# Patient Record
Sex: Male | Born: 1995 | Race: Black or African American | Hispanic: No | Marital: Single | Smoking: Never smoker
Health system: Southern US, Community
[De-identification: ages and names within clinical notes are randomized; demographics above are authoritative.]

---
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
## 2016-12-17 ENCOUNTER — Telehealth: Payer: Self-pay | Admitting: Family Medicine

## 2016-12-17 NOTE — Telephone Encounter (Signed)
Patients mother is calling to check about Alexis Waller's shots.  Mom checked with the health department and was told that [facility] did not have all of his shots in the system.  Mom would like a nurse to look into this and call her back.

## 2016-12-17 NOTE — Telephone Encounter (Signed)
Spoke with patient's mother and informed her patient will need a menactra, hep A,varicella. Patient's mother verbalized understanding.

## 2017-03-30 ENCOUNTER — Ambulatory Visit (HOSPITAL_COMMUNITY)
Admission: RE | Admit: 2017-03-30 | Discharge: 2017-03-30 | Disposition: A | Discharge status: Home / Self Care | Payer: Commercial Managed Care - PPO | Source: Ambulatory Visit | Attending: Family Medicine | Admitting: Family Medicine

## 2017-03-30 ENCOUNTER — Encounter: Payer: Self-pay | Admitting: Family Medicine

## 2017-03-30 ENCOUNTER — Ambulatory Visit (INDEPENDENT_AMBULATORY_CARE_PROVIDER_SITE_OTHER): Payer: Commercial Managed Care - PPO | Admitting: Family Medicine

## 2017-03-30 VITALS — BP 120/80 | Ht 66.5 in | Wt 157.0 lb

## 2017-03-30 DIAGNOSIS — M79661 Pain in right lower leg: Secondary | ICD-10-CM | POA: Diagnosis not present

## 2017-03-30 DIAGNOSIS — M2550 Pain in unspecified joint: Secondary | ICD-10-CM

## 2017-03-30 DIAGNOSIS — R5383 Other fatigue: Secondary | ICD-10-CM | POA: Diagnosis not present

## 2017-03-30 DIAGNOSIS — M79662 Pain in left lower leg: Secondary | ICD-10-CM

## 2017-03-30 DIAGNOSIS — Z79899 Other long term (current) drug therapy: Secondary | ICD-10-CM | POA: Diagnosis not present

## 2017-03-30 DIAGNOSIS — E559 Vitamin D deficiency, unspecified: Secondary | ICD-10-CM

## 2017-03-30 LAB — POCT HEMOGLOBIN: HEMOGLOBIN: 18.7 g/dL — AB (ref 14.1–18.1)

## 2017-03-30 NOTE — Progress Notes (Signed)
   Subjective:    Patient ID: Alexis Waller, male    DOB: 02-May-1995, 21 y.o.   MRN: [id number]  HPI  Patient is here today for a follow up. He states he has been having some lower leg pain.He is also feeling tired a lot.  This patient has been having significant fatigue tiredness feeling rundown in addition to this has noticed intermittent lower leg pain bilateral over the past couple months he also relates knee pain when he does deep squats  He is in college he does not eat healthy he does tend to sleep sometimes a lot other times not as much stays up until somewhere around 10 PM or midnight gets up around 10 AM on the weekends tends to sleep more  Denies fevers chills sweats vomiting diarrhea denies muscle aches headaches does relate lower leg pain  Lower leg pain as a aching discomfort in the lower legs in the tibia-fibula region along with the muscles no specific spot tenderness both legs bother him equally Results for orders placed or performed in visit on 03/30/17  POCT hemoglobin  Result Value Ref Range   Hemoglobin 18.7 (A) 14.1 - 18.1 g/dL    Review of Systems  Constitutional: Negative for activity change, fatigue and fever.  Respiratory: Negative for cough and shortness of breath.   Cardiovascular: Negative for chest pain and leg swelling.  Neurological: Negative for headaches.   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 No distress.  Cardiovascular: Normal rate, regular rhythm and normal heart sounds.  No murmur heard. Pulmonary/Chest: Effort normal and breath sounds normal. No respiratory distress.  Musculoskeletal: He exhibits no edema.  Lymphadenopathy:    He has no cervical adenopathy.  Neurological: He is alert.  Psychiatric: His behavior is normal.  Vitals reviewed.  Leg  exam is normal no masses are felt no tenderness  Abdomen is soft no masses or tenderness testicular exam normal     Assessment & Plan:  Significant lower leg pain and discomfort could well be just musculoskeletal Tylenol as needed lab work indicated await results in addition to this x-rays of the lower legs to rule out the possibility of cyst or bone lesions  Significant fatigue tiredness encouraged young man to have a good sleep schedule and proper nutrition to eat healthier less junk food more vegetables and fruits.  Lab work indicated await the results.  Will follow-up later in the year if ongoing issues await the results of the test before making this decision

## 2017-03-31 LAB — BASIC METABOLIC PANEL
BUN/Creatinine Ratio: 11 (ref 9–20)
BUN: 12 mg/dL (ref 6–20)
CO2: 24 mmol/L (ref 20–29)
CREATININE: 1.09 mg/dL (ref 0.76–1.27)
Calcium: 9.5 mg/dL (ref 8.7–10.2)
Chloride: 100 mmol/L (ref 96–106)
GFR calc Af Amer: 112 mL/min/{1.73_m2} (ref >59)
GFR calc non Af Amer: 97 mL/min/{1.73_m2} (ref >59)
GLUCOSE: 96 mg/dL (ref 65–99)
Potassium: 4.5 mmol/L (ref 3.5–5.2)
Sodium: 141 mmol/L (ref 134–144)

## 2017-03-31 LAB — CBC WITH DIFFERENTIAL/PLATELET
BASOS ABS: 0 10*3/uL (ref 0.0–0.2)
Basos: 0 %
EOS (ABSOLUTE): 0 10*3/uL (ref 0.0–0.4)
Eos: 1 %
Hematocrit: 49.5 % (ref 37.5–51.0)
Hemoglobin: 17.1 g/dL (ref 13.0–17.7)
IMMATURE GRANS (ABS): 0 10*3/uL (ref 0.0–0.1)
Immature Granulocytes: 0 %
LYMPHS: 46 %
Lymphocytes Absolute: 3.8 10*3/uL — ABNORMAL HIGH (ref 0.7–3.1)
MCH: 30.2 pg (ref 26.6–33.0)
MCHC: 34.5 g/dL (ref 31.5–35.7)
MCV: 87 fL (ref 79–97)
Monocytes Absolute: 0.8 10*3/uL (ref 0.1–0.9)
Monocytes: 10 %
NEUTROS ABS: 3.4 10*3/uL (ref 1.4–7.0)
Neutrophils: 43 %
PLATELETS: 242 10*3/uL (ref 150–379)
RBC: 5.67 x10E6/uL (ref 4.14–5.80)
RDW: 13.7 % (ref 12.3–15.4)
WBC: 8.1 10*3/uL (ref 3.4–10.8)

## 2017-03-31 LAB — TSH: TSH: 3.92 u[IU]/mL (ref 0.450–4.500)

## 2017-03-31 LAB — HEPATIC FUNCTION PANEL
ALT: 35 IU/L (ref 0–44)
AST: 28 IU/L (ref 0–40)
Albumin: 5 g/dL (ref 3.5–5.5)
Alkaline Phosphatase: 112 IU/L (ref 39–117)
BILIRUBIN, DIRECT: 0.1 mg/dL (ref 0.00–0.40)
Bilirubin Total: 0.3 mg/dL (ref 0.0–1.2)
TOTAL PROTEIN: 7.6 g/dL (ref 6.0–8.5)

## 2017-03-31 LAB — CK: CK TOTAL: 117 U/L (ref 24–204)

## 2017-03-31 LAB — VITAMIN D 25 HYDROXY (VIT D DEFICIENCY, FRACTURES): VIT D 25 HYDROXY: 9.5 ng/mL — AB (ref 30.0–100.0)

## 2017-04-01 MED ORDER — VITAMIN D (ERGOCALCIFEROL) 1.25 MG (50000 UNIT) PO CAPS: 50000.0000 [IU] | ORAL_CAPSULE | ORAL | 0 refills | Status: AC

## 2017-04-01 NOTE — Addendum Note (Signed)
Addended by: Margaretha SheffieldBROWN, Dimond Crotty S on: 04/01/2017 04:09 PM   Modules accepted: Orders

## 2017-04-28 ENCOUNTER — Telehealth: Payer: Self-pay | Admitting: Family Medicine

## 2017-04-28 DIAGNOSIS — R7989 Other specified abnormal findings of blood chemistry: Secondary | ICD-10-CM

## 2017-04-28 DIAGNOSIS — R5383 Other fatigue: Secondary | ICD-10-CM

## 2017-04-28 NOTE — Telephone Encounter (Signed)
Left message to return call 

## 2017-04-28 NOTE — Telephone Encounter (Signed)
Mom called stating that the pt got a copy of his labs online and is concerned with some of the levels being elevated. Mom is wanting the nurse to go over those when she calls as well to ease his mind.

## 2017-04-28 NOTE — Telephone Encounter (Signed)
Pt called stating that he is still very tired and has no energy. Pt states that he has taken all of the vitamin D that he was recently prescribed. Pt is away to college and has scheduled an appt with student health but is requesting a call from a nurse regarding this. Pt is wanting to know if there is something else that can be done. Please advise.

## 2017-04-28 NOTE — Telephone Encounter (Signed)
Patient was seen 03/20/17 for fatigue-had labs- still experiencing significant fatigue.

## 2017-04-28 NOTE — Telephone Encounter (Signed)
ERROR

## 2017-04-29 NOTE — Telephone Encounter (Signed)
Nurses- I believe with the patient is getting concerned about is more than likely he had a fingerstick hemoglobin which was elevated but a venous CBC is much more accurate and his hemoglobin look fine also his neutrophils and lymphocyte percentages was fine his absolute lymphocyte count slightly elevated but because his neutrophil and lymphocyte percentages look normal this really does not mean anything.  His vitamin D level was low but this is being treated.  I would recommend he can repeat a CBC and vitamin D approximately 2 months.  Overall his blood work looked very reassuring

## 2017-05-03 NOTE — Telephone Encounter (Signed)
Patient is aware of all. He will have labs recheck in two months. Lab orders placed in the system.

## 2017-05-03 NOTE — Addendum Note (Signed)
Addended by: Meredith LeedsSUTTON, CRYSTAL L on: 05/03/2017 08:20 AM   Modules accepted: Orders

## 2017-05-10 ENCOUNTER — Telehealth: Payer: Self-pay

## 2017-05-10 NOTE — Telephone Encounter (Signed)
Patient mother calling today states her son(is at [employer]) is still complaining of being tired all the time.He was seen here in  Dec and was told he had low Vit d . Was give Vit D 50,000u once Q week for four weeks. He is no longer on this,but mother sent OTC Vit D with him 5,000u and taking plans on taking this once Q week. [facility] they told him he was fine at the time.I asked if any other symptoms she says he gets winded some times,but not nothing unusual. I have given her an appt for 05/18/2016 this is the next time he can be up here,and if any problems before then to please have seen at campus Dr.,urgent care,or Ed.

## 2017-05-19 ENCOUNTER — Ambulatory Visit: Payer: Commercial Managed Care - PPO | Admitting: Family Medicine

## 2017-06-15 ENCOUNTER — Encounter: Payer: Self-pay | Admitting: Family Medicine

## 2017-06-15 ENCOUNTER — Ambulatory Visit (INDEPENDENT_AMBULATORY_CARE_PROVIDER_SITE_OTHER): Payer: Commercial Managed Care - PPO | Admitting: Family Medicine

## 2017-06-15 VITALS — BP 112/74 | Temp 98.6°F | Ht 66.5 in | Wt 161.1 lb

## 2017-06-15 DIAGNOSIS — M79662 Pain in left lower leg: Secondary | ICD-10-CM

## 2017-06-15 DIAGNOSIS — R5383 Other fatigue: Secondary | ICD-10-CM

## 2017-06-15 DIAGNOSIS — M79661 Pain in right lower leg: Secondary | ICD-10-CM

## 2017-06-15 DIAGNOSIS — E559 Vitamin D deficiency, unspecified: Secondary | ICD-10-CM | POA: Diagnosis not present

## 2017-06-15 DIAGNOSIS — M791 Myalgia, unspecified site: Secondary | ICD-10-CM | POA: Diagnosis not present

## 2017-06-15 LAB — POCT HEMOGLOBIN: HEMOGLOBIN: 16.6 g/dL (ref 14.1–18.1)

## 2017-06-15 NOTE — Progress Notes (Signed)
   Subjective:    Patient ID: Alexis Waller, male    DOB: 05-23-1995, 22 y.o.   MRN: [id number]  HPI Patient is here today with complaints of extreme fatigue.Its a little better.\sleeps fairly well usually 10-12 hours denies excess use of caffeine's He does state that his fatigue on some days is not so bad He denies any chest tightness pressure pain denies sweats chills Denies high fevers No cough or runny nose vomiting or diarrhea  Significant fatigue tiredness.  Finds himself feeling tired on some days not as bad on other days.  He relates lower leg pain and discomfort especially whenever he tries to do extensive amount of exercise such as walking or running  Bilateral knee and calve hurt when walking.   Review of Systems  Constitutional: Negative for activity change, fatigue and fever.  HENT: Negative for congestion and rhinorrhea.   Respiratory: Negative for cough and shortness of breath.   Cardiovascular: Negative for chest pain and leg swelling.  Gastrointestinal: Negative for abdominal pain, diarrhea and nausea.  Genitourinary: Negative for dysuria and hematuria.  Musculoskeletal: Positive for myalgias.  Neurological: Negative for weakness and headaches.  Psychiatric/Behavioral: Negative for behavioral problems.   Results for orders placed or performed in visit on 06/15/17  POCT hemoglobin  Result Value Ref Range   Hemoglobin 16.6 14.1 - 18.1 g/dL   Patient has a history of vitamin D deficiency he will take vitamin D supplement we will recheck this again in several months    Objective:   Physical Exam  Constitutional: He appears well-nourished.  Cardiovascular: Normal rate, regular rhythm and normal heart sounds.  No murmur heard. Pulmonary/Chest: Effort normal and breath sounds normal.  Musculoskeletal: He exhibits no edema.  Lymphadenopathy:    He has no cervical adenopathy.  Neurological: He is alert.  Psychiatric: His behavior is normal.  Vitals  reviewed.         Assessment & Plan:  Bilateral leg pain with activity possible shin splints versus compartment syndrome will need to get in with sports medicine orthopedics for further evaluation  Patient will be doing some intermittent exercising over the next few weeks and notifying us regarding how his legs are feeling with running and jogging.  Significant fatigue tiredness proper sleeping proper eating reinforced to follow-up on lab work including vitamin D recommended.

## 2018-09-24 IMAGING — DX DG TIBIA/FIBULA 2V*R*
4 series · 4 of 4 positions shown · non-contrast
Comparison: None.

CLINICAL DATA: Lower leg pain bilaterally

EXAM:
RIGHT TIBIA AND FIBULA - 2 VIEW

[tibia ap (1 of 2)]
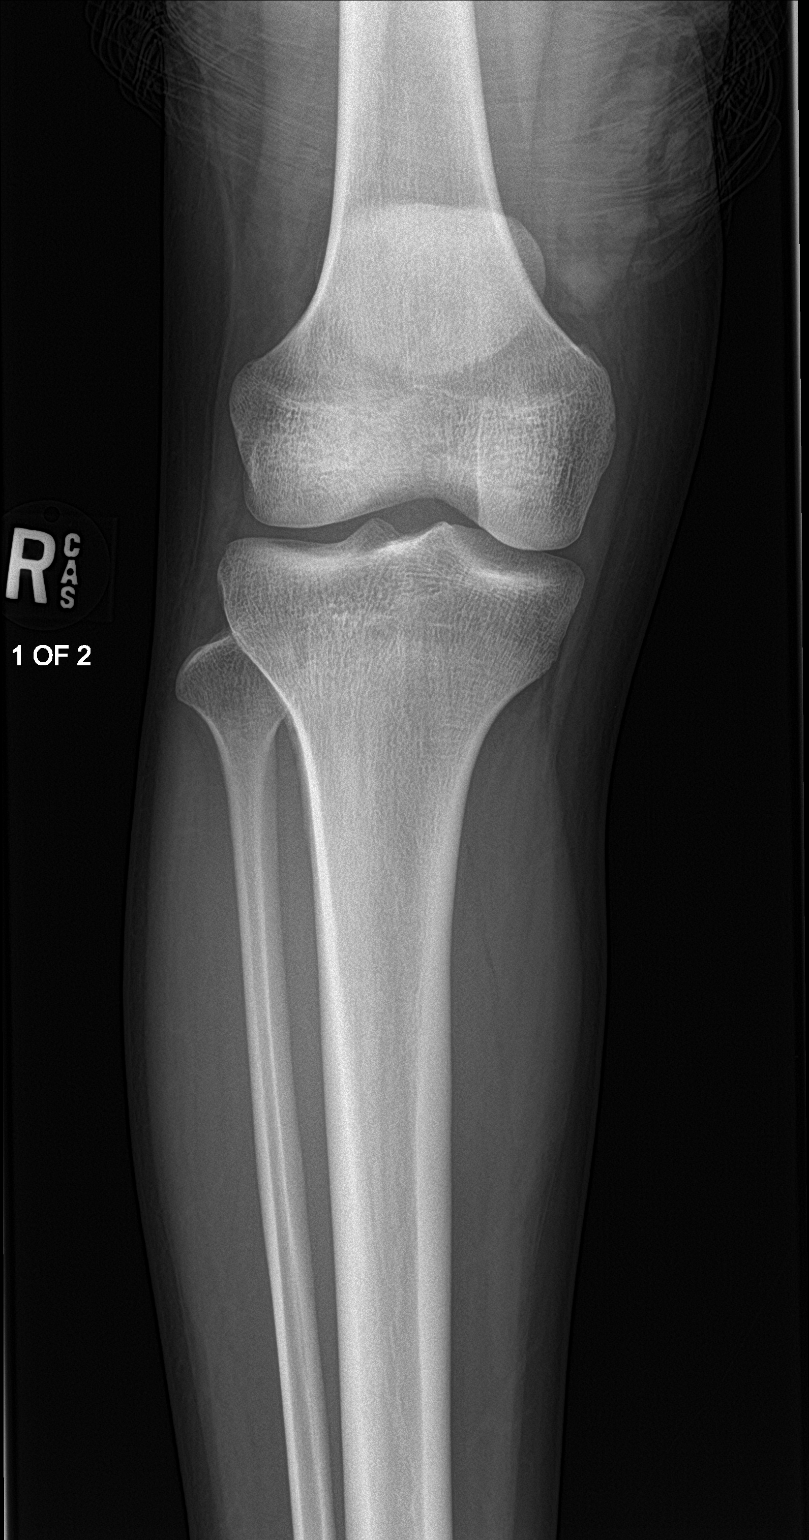

[tibia ap (2 of 2)]
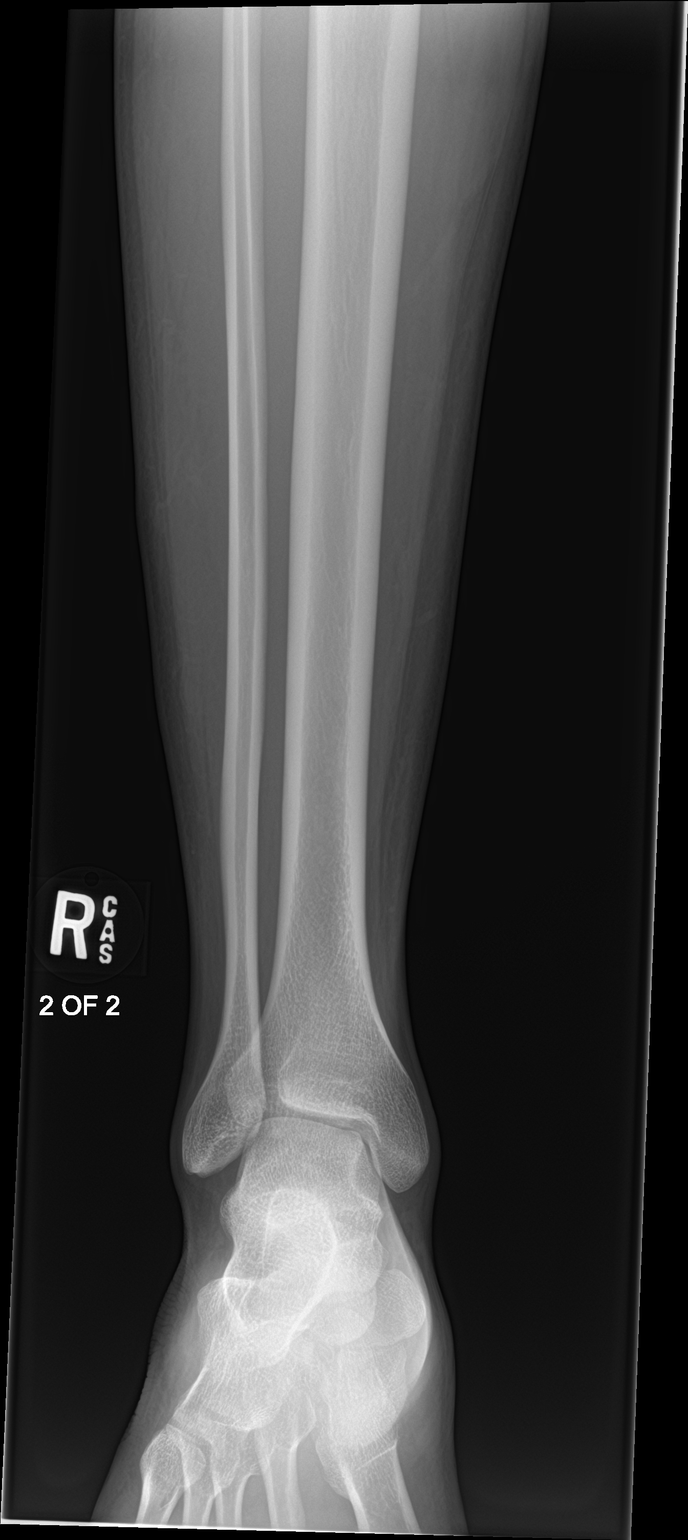

[tibia lat (1 of 2)]
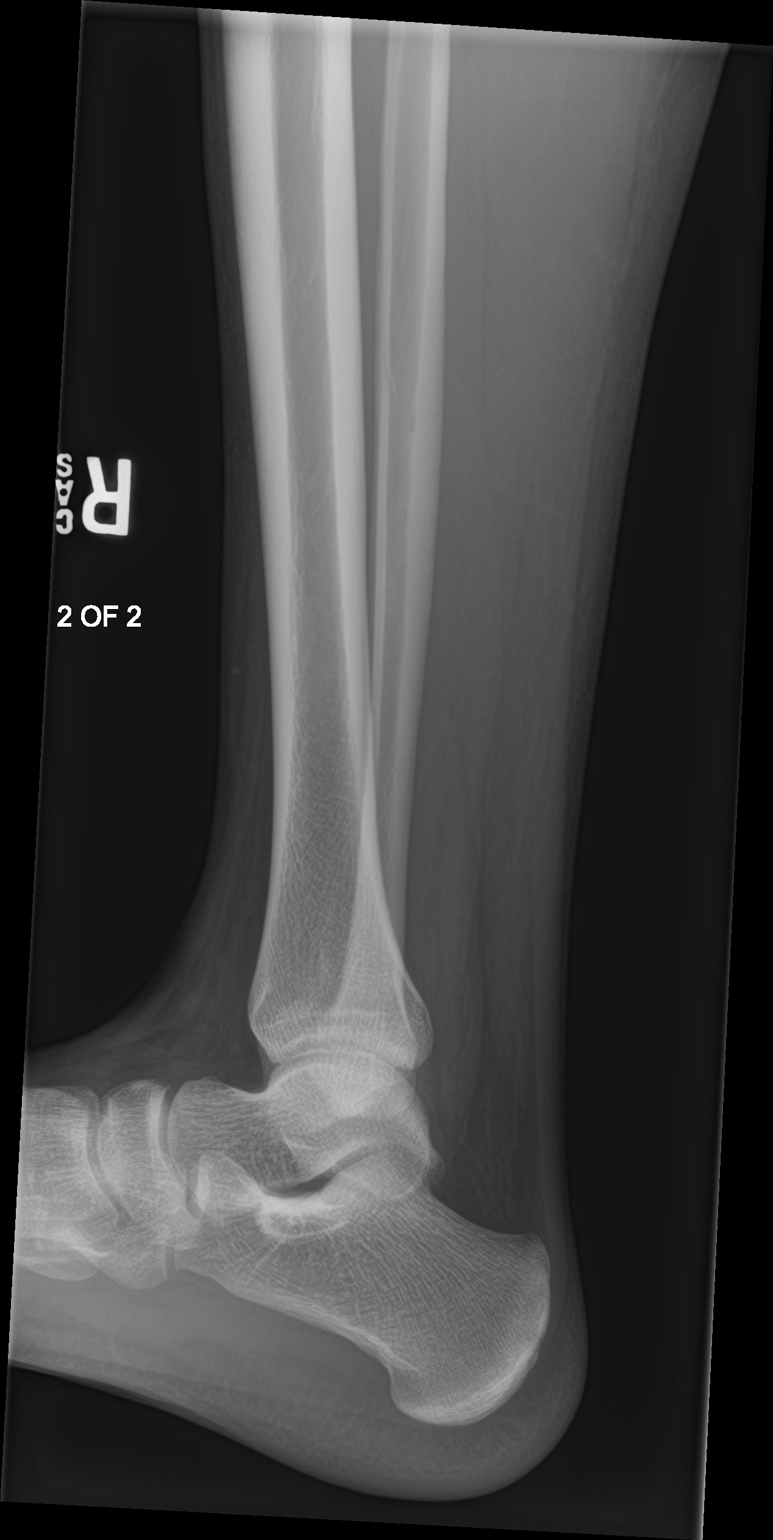

[tibia lat (2 of 2)]
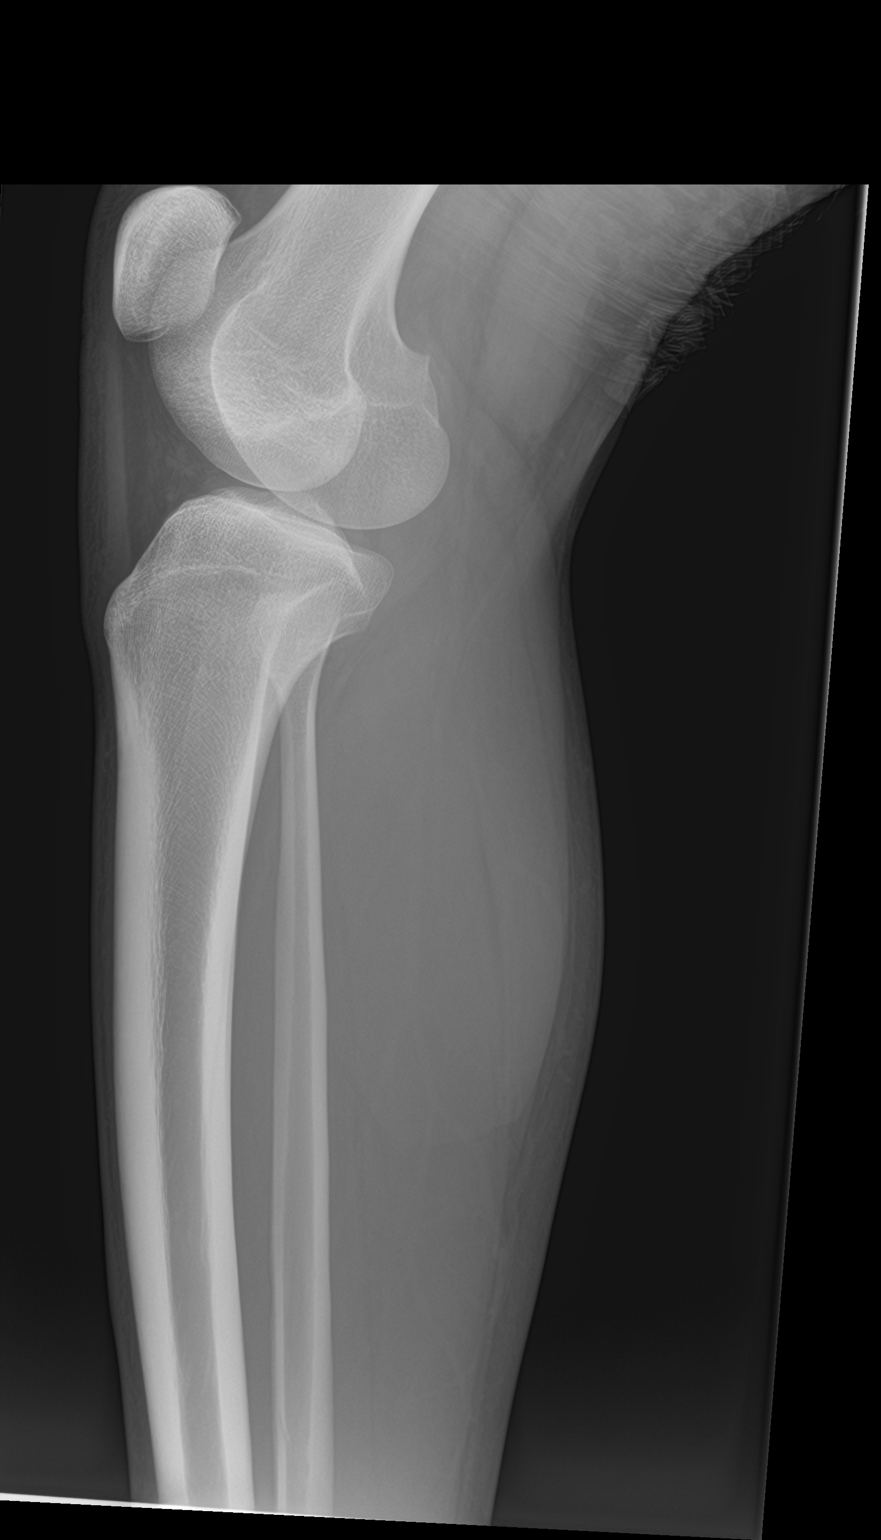

[4 of 4 positions shown; findings below may reference images not displayed]

FINDINGS: There is no evidence of fracture or other focal bone lesions. Soft
tissues are unremarkable.
IMPRESSION: Negative.

## 2023-02-11 DIAGNOSIS — Z419 Encounter for procedure for purposes other than remedying health state, unspecified: Secondary | ICD-10-CM | POA: Diagnosis not present

## 2023-03-13 DIAGNOSIS — Z419 Encounter for procedure for purposes other than remedying health state, unspecified: Secondary | ICD-10-CM | POA: Diagnosis not present

## 2023-04-13 DIAGNOSIS — Z419 Encounter for procedure for purposes other than remedying health state, unspecified: Secondary | ICD-10-CM | POA: Diagnosis not present

## 2023-05-14 DIAGNOSIS — Z419 Encounter for procedure for purposes other than remedying health state, unspecified: Secondary | ICD-10-CM | POA: Diagnosis not present

## 2023-06-11 DIAGNOSIS — Z419 Encounter for procedure for purposes other than remedying health state, unspecified: Secondary | ICD-10-CM | POA: Diagnosis not present

## 2023-07-23 DIAGNOSIS — Z419 Encounter for procedure for purposes other than remedying health state, unspecified: Secondary | ICD-10-CM | POA: Diagnosis not present

## 2023-08-22 DIAGNOSIS — Z419 Encounter for procedure for purposes other than remedying health state, unspecified: Secondary | ICD-10-CM | POA: Diagnosis not present

## 2023-09-22 DIAGNOSIS — Z419 Encounter for procedure for purposes other than remedying health state, unspecified: Secondary | ICD-10-CM | POA: Diagnosis not present

## 2023-10-22 DIAGNOSIS — Z419 Encounter for procedure for purposes other than remedying health state, unspecified: Secondary | ICD-10-CM | POA: Diagnosis not present

## 2023-11-22 DIAGNOSIS — Z419 Encounter for procedure for purposes other than remedying health state, unspecified: Secondary | ICD-10-CM | POA: Diagnosis not present

## 2023-12-23 DIAGNOSIS — Z419 Encounter for procedure for purposes other than remedying health state, unspecified: Secondary | ICD-10-CM | POA: Diagnosis not present
